# Patient Record
Sex: Male | Born: 1950 | Race: Black or African American | Hispanic: No
Health system: Southern US, Community
[De-identification: ages and names within clinical notes are randomized; demographics above are authoritative.]

---

## 2001-08-05 ENCOUNTER — Encounter: Payer: Self-pay | Admitting: Internal Medicine

## 2001-08-05 ENCOUNTER — Ambulatory Visit (HOSPITAL_COMMUNITY): Admission: RE | Admit: 2001-08-05 | Discharge: 2001-08-05 | Payer: Self-pay | Admitting: Internal Medicine

## 2001-11-05 ENCOUNTER — Ambulatory Visit (HOSPITAL_COMMUNITY): Admission: RE | Admit: 2001-11-05 | Discharge: 2001-11-05 | Payer: Self-pay | Admitting: Internal Medicine

## 2001-11-05 ENCOUNTER — Encounter: Payer: Self-pay | Admitting: Internal Medicine

## 2001-12-08 ENCOUNTER — Encounter: Payer: Self-pay | Admitting: Neurosurgery

## 2001-12-08 ENCOUNTER — Inpatient Hospital Stay (HOSPITAL_COMMUNITY): Admission: RE | Admit: 2001-12-08 | Discharge: 2001-12-09 | Payer: Self-pay | Admitting: Neurosurgery

## 2002-02-08 ENCOUNTER — Encounter: Payer: Self-pay | Admitting: Neurosurgery

## 2002-02-08 ENCOUNTER — Encounter: Admission: RE | Admit: 2002-02-08 | Discharge: 2002-02-08 | Payer: Self-pay | Admitting: Neurosurgery

## 2002-02-14 ENCOUNTER — Encounter: Payer: Self-pay | Admitting: Neurosurgery

## 2002-02-14 ENCOUNTER — Ambulatory Visit (HOSPITAL_COMMUNITY): Admission: RE | Admit: 2002-02-14 | Discharge: 2002-02-14 | Payer: Self-pay | Admitting: Neurosurgery

## 2002-03-31 ENCOUNTER — Encounter: Payer: Self-pay | Admitting: Emergency Medicine

## 2002-03-31 ENCOUNTER — Inpatient Hospital Stay (HOSPITAL_COMMUNITY): Admission: EM | Admit: 2002-03-31 | Discharge: 2002-04-04 | Payer: Self-pay | Admitting: Emergency Medicine

## 2002-04-01 ENCOUNTER — Encounter: Payer: Self-pay | Admitting: General Surgery

## 2003-12-04 ENCOUNTER — Ambulatory Visit (HOSPITAL_COMMUNITY): Admission: RE | Admit: 2003-12-04 | Discharge: 2003-12-04 | Payer: Self-pay | Admitting: Internal Medicine

## 2006-02-27 ENCOUNTER — Ambulatory Visit (HOSPITAL_COMMUNITY): Admission: RE | Admit: 2006-02-27 | Discharge: 2006-02-27 | Payer: Self-pay | Admitting: Internal Medicine

## 2006-04-17 ENCOUNTER — Ambulatory Visit (HOSPITAL_COMMUNITY): Admission: RE | Admit: 2006-04-17 | Discharge: 2006-04-17 | Payer: Self-pay | Admitting: Neurology

## 2007-09-13 ENCOUNTER — Ambulatory Visit (HOSPITAL_COMMUNITY): Admission: RE | Admit: 2007-09-13 | Discharge: 2007-09-13 | Payer: Self-pay | Admitting: Nephrology

## 2009-05-14 ENCOUNTER — Ambulatory Visit (HOSPITAL_COMMUNITY): Admission: RE | Admit: 2009-05-14 | Discharge: 2009-05-14 | Payer: Self-pay | Admitting: Ophthalmology
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
## 2011-01-10 NOTE — Consult Note (Signed)
   NAMEDERRICK, Troy Ochoa                             ACCOUNT NO.:  [id number]   MEDICAL RECORD NO.:  [id number]                   PATIENT TYPE:  INP   LOCATION:  A319                                 FACILITY:  [facility]   PHYSICIAN:  Dennie Maizes, M.D.                DATE OF BIRTH:  Oct 01, 1950   DATE OF CONSULTATION:  04/02/2002  DATE OF DISCHARGE:  04/04/2002                               UROLOGY CONSULTATION   REASON FOR CONSULTATION:  Right flank pain, right ureteral calculus.   HISTORY OF CONSULTATION REPORT:  This 60 year old male had been admitted to  [facility] on March 31, 2002.  He experienced severe right flank  pain radiating to the front associated with nausea and vomiting for about 2-  3 days.  He denied having any past history of GU problems or urolithiasis.  There is no history of voiding difficulty, gross hematuria, fever or chills.  After hospitalization, he had been having low-grade fever for the past two  days.  Evaluation by Dr. Lovell Sheehan had been done.  He has also been evaluated  with upper endoscopy.   PAST MEDICAL HISTORY:  History of hypertension, chronic back pain and  depression, status post cholecystectomy several years ago.   PHYSICAL EXAMINATION:  ABDOMEN: Soft, no palpable flank mass, mild right  flank tenderness is noted, bladder not palpable.  GENITAL: Penis and testes normal.  RECTAL: Reveals a benign prostate.   LABORATORY DATA:  Urinalysis is clear.  CBC: WBC 5.5, hemoglobin 15.2,  hematocrit 43.1.  BUN low, creatinine 1.8.   CT of the abdomen and pelvis with contrast revealed a 3.5 mm size stone  lying in the lumen of the bladder.  Probably this has passed from the right  ureter.   IMPRESSION:  Right renal colic and right ureteral calculus (passed).   The patient had been having low-grade fever for the past two days.  Urine  culture and sensitivity, blood culture and sensitivity have been done; the  results are pending.  He had been  treated with Levaquin.  The etiology as  yet is unknown at this time.   RECOMMENDATIONS:  The patient has a good chance of passing the small bladder  calculus.  We will follow the patient with you.  Thanks for this consult.                                              Dennie Maizes, M.D.   SK/MEDQ  D:  04/02/2002  T:  04/06/2002  Job:  [id number]   cc:   Tesfaye D. Felecia Shelling, M.D.

## 2011-01-10 NOTE — H&P (Signed)
NAMEDERIUS, GHOSH                             ACCOUNT NO.:  [id number]   MEDICAL RECORD NO.:  [id number]                   PATIENT TYPE:  INP   LOCATION:  A319                                 FACILITY:  [facility]   PHYSICIAN:  Tesfaye D. Felecia Shelling, M.D.              DATE OF BIRTH:  Nov 25, 1950   DATE OF ADMISSION:  03/31/2002  DATE OF DISCHARGE:  04/04/2002                                HISTORY & PHYSICAL   CHIEF COMPLAINT:  Abdominal pain, nausea and vomiting.   HISTORY OF PRESENT ILLNESS:  This is a 60 year old black male with a history  of  hypertension, back pain, osteoarthritis and a depression disorder, who  came to the emergency room with the sudden onset of right upper quadrant  pain and nausea and vomiting.  The patient was apparently doing well before  the episode, when he suddenly experienced a sharp pain in the right upper  quadrant which was immediately followed with recurrent nausea and vomiting.  The vomitus contained food and bilious material.  The patient then came to  the emergency room, where he was evaluated.  A KUB done in the emergency  room was within the normal limits.  His amylase was slightly elevated,  however, the lipase was within normal limits.  The rest of his labs  including CBC, Chem-7 and a urinalysis were within normal limits.  Surgical  consult was done in emergency room and was evaluated by Dr. Dalia Heading,  who thought there was no sign of acute abdomen.  The patient was then  admitted on IV fluid and pain and antiemetic medications.  He has no history  of alcohol and no history of pancreatitis in the past.  The patient had  surgery about 40 years ago for cholecystectomy.  He has no fever, chills,  dysuria, urgency or frequency of urination.   PAST MEDICAL HISTORY:  1. Hypertension.  2. Osteoarthritis.  3. Back pain.  4. Depression disorder.   CURRENT MEDICATIONS:  1. Lortab 5/500 mg one tablet q.6h. p.r.n.  2. Zoloft.  3. Lotrel 5/20 mg one  tablet p.o. q.d.   PERSONAL AND SOCIAL HISTORY:  The patient is currently disabled due to his  illness.  He lives alone.  Denies history of alcohol or tobacco abuse.   PHYSICAL EXAMINATION:  GENERAL:  The patient is alert, awake, acutely sick  looking.  VITALS:  Blood pressure 183/96, pulse 73, respiratory rate 24, temperature  98.8 degrees Fahrenheit.  HEENT:  Pupils are equal and reactive.  NECK:  Supple.  CHEST:  Clear lung fields with good air entry.  CARDIOVASCULAR:  First and second heart sounds heard.  No murmur.  No  gallop.  ABDOMEN:  There is diffuse tenderness.  Bowel sounds are positive.  There is  a scar on the midline and right upper quadrant area.  EXTREMITIES:  No leg edema.   LABORATORY DATA  ON ADMISSION:  WBC 5.5, hemoglobin 15.2, hematocrit 43,  platelets 216,000.  Sodium 142, potassium 3.3, chloride 109, CO2 27, glucose  126, BUN 9 and alkaline phosphatase 58, SGPT 26, SGOT 23, calcium 9.4,  lipase 26 and amylase 141.   ASSESSMENT:  1. This is a 60 year old male patient with history of hypertension and     osteoarthritis, who presented with nausea, vomiting and abdominal pain.     He has a slightly elevated amylase level.  No history of and gallbladder     disease.  The etiology of his abdominal pain is not clear at this time.     Differential diagnosis includes gastroenteritis, peptic ulcer disease or     pancreatitis.  2. Hypertension.  3. Osteoarthritis.  4. Back pain.  5. History of depression disorder.   PLAN:  Will keep the patient n.p.o.  Will start him on IV fluids.  Will  start on Protonix 40 mg IV piggyback.  Will get CT scan of his abdomen.                                               Tesfaye D. Felecia Shelling, M.D.    TDF/MEDQ  D:  04/01/2002  T:  04/04/2002  Job:  [id number]

## 2011-01-10 NOTE — Op Note (Signed)
[place]  Patient:    Troy Ochoa, Troy Ochoa Visit Number: [id number] MRN: [id number]          Service Type: SUR Location: [id number] Attending Physician:  Donn Pierini Dictated by:   Julio Sicks, M.D. Proc. 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  OPERATION PERFORMED:  C5-6 and C6-7 anterior cervical diskectomy and fusion with allograft and anterior plate instrumentation.  SURGEON:  Julio Sicks, M.D.  ASSISTANT:  Reinaldo Meeker, M.D.  ANESTHESIA:  General endotracheal.  INDICATIONS FOR PROCEDURE:  Troy Ochoa is a 60 year old male with a history of chronic neck pain and bilateral upper extremity pain with associated progressive weakness and paresthesias involving both distal upper extremities. MRI scanning demonstrates significant spondylosis at C5-6 and C6-7 with marked spinal stenosis and spinal cord compression.  The patient has been counseled as to his options.  He has decided to proceed with C5-6 and C6-7 anterior cervical diskectomy and fusion with allograft and anterior plating for hopeful improvement of symptoms.  DESCRIPTION OF PROCEDURE:  The patient was taken to the operating room and placed on the operating table in supine position.  After an adequate level of anesthesia was achieved, the patient was positioned supine with the neck slightly extended and held in place with halter traction.  The patients anterior cervical region was shaved and prepped sterilely.  A 10 blade was used to make a linear incision overlying the C6 level.  This was carried down sharply to the platysma.  The platysma was then divided vertically and dissection proceeded along the medial border of the sternocleidomastoid muscle and carotid sheath.  The trachea and esophagus were  mobilized and retracted towards the left.  Prevertebral fascia was stripped off the anterior spinal column.  The longus colli muscles were then elevated bilaterally using electrocautery.  Deep self-retaining retractor was placed.  Intraoperative fluoroscopy was used and the C5-6 and C6-7 levels were confirmed.  The disk space was then incised with a 15 blade in a rectangular fashion at both levels.  A wide disk space clean-out was then achieved at both levels using pituitary rongeurs, forward and backward angled Carlens curets, Kerrison rongeurs and a high speed drill.  All elements of the disk were removed down to the posterior annulus.  Microscope was brought into the field and starting first at C5-6, the remaining aspects of annulus and osteophytes were removed down to the level of the posterior longitudinal ligament.  The posterior longitudinal ligament was then elevated and resected in piecemeal fashion using Kerrison rongeurs.  The underlying thecal sac was identified.  A wide central decompression was then performed using Kerrison rongeurs to undercut the bodies of C5 and C6.  Wide anterior foraminotomies were then performed along the courses of the exiting C6 nerve roots bilaterally.  At this point a blunt probe was passed easily both superiorly and inferiorly out each foramen. There was no evidence of injury to thecal sac or nerve roots.  Attention was then placed at C6-7.  Once again a high speed drill was used to remove remaining annulus and osteophytes down to the level of the posterior longitudinal ligament.  The posterior longitudinal ligament was then elevated and resected  in piecemeal fashion using Kerrison rongeurs.  The underlying thecal sac was identified.  Wide central decompression was then performed by undercutting the bodies at C6 and C7 using Kerrison rongeurs.  Decompression then proceeded out to each neural foramen at C7.  Wide anterior foraminotomies were  performed using Kerrison rongeurs bilaterally.  Both C7 nerve roots were identified and found to be widely decompressed.  Once again blunt probes passed easily along the course of the exiting nerve roots both superiorly and inferiorly.  The wound was then irrigated with antibiotic solution.  Gelfoam was placed topically at both levels for hemostasis which was found to be good. 7 mm patellar wedge allografts were then fashioned and impacted into place and recessed approximately 1 mm from the anterior cortical surface.  A 42 mm Atlantis anterior cervical plate was then placed over the C5, C6 and C7 levels.  It was then attached under fluoroscopic guidance by using 14 mm variable angle screws, two each at C5, C6 and at C7.  All six screws were given a final tightening and found to be solidly within bone.  Locking screws were engaged at all three levels.  The wound was then irrigated with antibiotic solution.  Retraction system was then removed.  Final images revealed good position of the bone grafts and hardware at the proper operative level with normal alignment of the spine.  Wound was inspected for hemostasis which was found to be good.  It was then closed in typical fashion. Steri-Strips and sterile dressing were applied.  There were no apparent complications.  The patient tolerated the procedure well and he returned to the recovery room postoperatively. Dictated by:   Julio Sicks, M.D. Attending Physician:  Donn Pierini DD:  12/08/01 TD:  12/08/01 Job: [id number]

## 2011-01-10 NOTE — Discharge Summary (Signed)
   NAMEZERRICK, HANSSEN                             ACCOUNT NO.:  [id number]   MEDICAL RECORD NO.:  [id number]                   PATIENT TYPE:  INP   LOCATION:  A319                                 FACILITY:  [facility]   PHYSICIAN:  Tesfaye D. Felecia Shelling, M.D.              DATE OF BIRTH:  1951/01/16   DATE OF ADMISSION:  03/31/2002  DATE OF DISCHARGE:  04/04/2002                                 DISCHARGE SUMMARY   DISCHARGE DIAGNOSES:  1. Renal colic.  2. Fever, etiology unknown.  3. Hypertension.  4. Back pain.  5. History of depression disorder.   DISCHARGE MEDICATIONS:  1. Levaquin 500 mg p.o. q.d. for three days.  2. Lortab 5/500 1 tablet p.o. q.6h. p.r.n.  3. Zoloft 75 mg p.o. q.d.  4. Lotrel 5/20 1 tablet p.o. q.d.   DISPOSITION:  The patient is discharged home in stable condition.   HOSPITAL COURSE:  This is a 60 year old male patient with a history of  hypertension, back pain, and depression disorder, admitted due to abdominal  pain, nausea, and vomiting.  The patient was found to have renal colic, and  a urological consult was done.  While he was in the hospital the patient  started spiking a fever for which he was treated with IV Levaquin.  There  was no urine or blood culture growth, but the patient gradually improved,  and his fever subsided.  The patient is discharged in stable condition, to  continue his medication, and to be followed as an outpatient.                                               Tesfaye D. Felecia Shelling, M.D.    TDF/MEDQ  D:  05/26/2002  T:  05/29/2002  Job:  [id number]

## 2011-01-31 ENCOUNTER — Other Ambulatory Visit: Payer: Self-pay | Admitting: Neurology

## 2011-01-31 DIAGNOSIS — M5416 Radiculopathy, lumbar region: Secondary | ICD-10-CM

## 2011-02-05 ENCOUNTER — Ambulatory Visit (HOSPITAL_COMMUNITY)
Admission: RE | Admit: 2011-02-05 | Discharge: 2011-02-05 | Disposition: A | Discharge status: Home / Self Care | Payer: Medicare HMO | Source: Ambulatory Visit | Attending: Neurology | Admitting: Neurology

## 2011-02-05 DIAGNOSIS — M545 Low back pain, unspecified: Secondary | ICD-10-CM | POA: Insufficient documentation

## 2011-02-05 DIAGNOSIS — M79609 Pain in unspecified limb: Secondary | ICD-10-CM | POA: Insufficient documentation

## 2011-02-05 DIAGNOSIS — M51379 Other intervertebral disc degeneration, lumbosacral region without mention of lumbar back pain or lower extremity pain: Secondary | ICD-10-CM | POA: Insufficient documentation

## 2011-02-05 DIAGNOSIS — R209 Unspecified disturbances of skin sensation: Secondary | ICD-10-CM | POA: Insufficient documentation

## 2011-02-05 DIAGNOSIS — M5137 Other intervertebral disc degeneration, lumbosacral region: Secondary | ICD-10-CM | POA: Insufficient documentation

## 2011-02-05 DIAGNOSIS — M5126 Other intervertebral disc displacement, lumbar region: Secondary | ICD-10-CM | POA: Insufficient documentation

## 2011-02-05 DIAGNOSIS — M5416 Radiculopathy, lumbar region: Secondary | ICD-10-CM

## 2012-07-08 ENCOUNTER — Encounter (HOSPITAL_COMMUNITY): Payer: Self-pay | Admitting: Pharmacy Technician

## 2012-07-12 ENCOUNTER — Ambulatory Visit (HOSPITAL_COMMUNITY)
Admission: RE | Admit: 2012-07-12 | Discharge: 2012-07-12 | Disposition: A | Discharge status: Home / Self Care | Payer: Medicare Other | Source: Ambulatory Visit | Attending: Ophthalmology | Admitting: Ophthalmology

## 2012-07-12 ENCOUNTER — Encounter (HOSPITAL_COMMUNITY)
Admission: RE | Disposition: A | Discharge status: Home / Self Care | Payer: Self-pay | Source: Ambulatory Visit | Attending: Ophthalmology

## 2012-07-12 ENCOUNTER — Encounter (HOSPITAL_COMMUNITY): Payer: Self-pay | Admitting: *Deleted

## 2012-07-12 DIAGNOSIS — H409 Unspecified glaucoma: Secondary | ICD-10-CM | POA: Insufficient documentation

## 2012-07-12 HISTORY — PX: SLT LASER APPLICATION: SHX6099

## 2012-07-12 SURGERY — SLT LASER APPLICATION
Anesthesia: LOCAL | Laterality: Right

## 2012-07-12 MED ORDER — APRACLONIDINE HCL 1 % OP SOLN
OPHTHALMIC | Status: AC
  Filled 2012-07-12: qty 0.1

## 2012-07-12 MED ORDER — APRACLONIDINE HCL 1 % OP SOLN
1.0000 [drp] | OPHTHALMIC | Status: AC
  Administered 2012-07-12 (×3): 1 [drp] via OPHTHALMIC

## 2012-07-12 MED ORDER — PILOCARPINE HCL 1 % OP SOLN
2.0000 [drp] | Freq: Once | OPHTHALMIC | Status: AC
  Administered 2012-07-12: 2 [drp] via OPHTHALMIC

## 2012-07-12 MED ORDER — TETRACAINE HCL 0.5 % OP SOLN
1.0000 [drp] | Freq: Once | OPHTHALMIC | Status: AC
  Administered 2012-07-12: 1 [drp] via OPHTHALMIC

## 2012-07-12 MED ORDER — PILOCARPINE HCL 1 % OP SOLN
OPHTHALMIC | Status: AC
  Filled 2012-07-12: qty 15

## 2012-07-12 MED ORDER — TETRACAINE HCL 0.5 % OP SOLN
OPHTHALMIC | Status: AC
  Filled 2012-07-12: qty 2

## 2012-07-12 NOTE — H&P (Signed)
I have reviewed the pre printed H&P, the patient was re-examined, and I have identified no significant interval changes in the patient's medical condition.  There is no change in the plan of care since the history and physical of record. 

## 2012-07-12 NOTE — Brief Op Note (Signed)
Troy Ochoa 07/12/2012  Susa Simmonds, MD  Yag Laser Self Test Completedyes. Procedure: SLT, right eye.  Eye Protection Worn by Staff yes. Laser In Use Sign on Door yes.  Laser: Nd:YAG Spot Size: Fixed Burst Mode: n/a Power Setting: 0.9 mJ/burst Position treated: 360degrees Number of shots: 64 Total energy delivered: 57.6 mJ  Patency of the peripheral iridotomy was confirmed visually.  The patient tolerated the procedure without difficulty. No complications were encountered.  Tenometer reading immediately after procedure: 18 mmHg.  The patient was discharged home with the instructions to continue all his current glaucoma medications, if any.  Patient verbalizes understanding of discharge instructions yes.   Notes:360 open, grade 4/4 with mild pigment

## 2012-07-13 ENCOUNTER — Encounter (HOSPITAL_COMMUNITY): Payer: Self-pay | Admitting: Ophthalmology

## 2014-02-16 ENCOUNTER — Other Ambulatory Visit: Payer: Self-pay | Admitting: Neurology

## 2014-02-16 ENCOUNTER — Ambulatory Visit (HOSPITAL_COMMUNITY)
Admission: RE | Admit: 2014-02-16 | Discharge: 2014-02-16 | Disposition: A | Discharge status: Home / Self Care | Payer: Medicare Other | Source: Ambulatory Visit | Attending: Neurology | Admitting: Neurology

## 2014-02-16 DIAGNOSIS — M47817 Spondylosis without myelopathy or radiculopathy, lumbosacral region: Secondary | ICD-10-CM | POA: Insufficient documentation

## 2014-02-16 DIAGNOSIS — M5146 Schmorl's nodes, lumbar region: Secondary | ICD-10-CM | POA: Insufficient documentation

## 2014-02-16 DIAGNOSIS — IMO0002 Reserved for concepts with insufficient information to code with codable children: Secondary | ICD-10-CM

## 2014-02-16 DIAGNOSIS — I700 Atherosclerosis of aorta: Secondary | ICD-10-CM | POA: Insufficient documentation

## 2015-08-30 DIAGNOSIS — I10 Essential (primary) hypertension: Secondary | ICD-10-CM | POA: Diagnosis not present

## 2015-08-30 DIAGNOSIS — M5090 Cervical disc disorder, unspecified, unspecified cervical region: Secondary | ICD-10-CM | POA: Diagnosis not present

## 2015-08-30 DIAGNOSIS — K219 Gastro-esophageal reflux disease without esophagitis: Secondary | ICD-10-CM | POA: Diagnosis not present

## 2015-08-30 DIAGNOSIS — Z6839 Body mass index (BMI) 39.0-39.9, adult: Secondary | ICD-10-CM | POA: Diagnosis not present

## 2015-10-09 DIAGNOSIS — Z79899 Other long term (current) drug therapy: Secondary | ICD-10-CM | POA: Diagnosis not present

## 2015-10-09 DIAGNOSIS — M545 Low back pain: Secondary | ICD-10-CM | POA: Diagnosis not present

## 2015-10-09 DIAGNOSIS — G4710 Hypersomnia, unspecified: Secondary | ICD-10-CM | POA: Diagnosis not present

## 2015-10-09 DIAGNOSIS — I10 Essential (primary) hypertension: Secondary | ICD-10-CM | POA: Diagnosis not present

## 2015-11-29 DIAGNOSIS — I10 Essential (primary) hypertension: Secondary | ICD-10-CM | POA: Diagnosis not present

## 2015-11-29 DIAGNOSIS — K219 Gastro-esophageal reflux disease without esophagitis: Secondary | ICD-10-CM | POA: Diagnosis not present

## 2015-11-29 DIAGNOSIS — G4733 Obstructive sleep apnea (adult) (pediatric): Secondary | ICD-10-CM | POA: Diagnosis not present

## 2015-11-30 DIAGNOSIS — Z79899 Other long term (current) drug therapy: Secondary | ICD-10-CM | POA: Diagnosis not present

## 2015-11-30 DIAGNOSIS — G4710 Hypersomnia, unspecified: Secondary | ICD-10-CM | POA: Diagnosis not present

## 2015-11-30 DIAGNOSIS — M545 Low back pain: Secondary | ICD-10-CM | POA: Diagnosis not present

## 2015-11-30 DIAGNOSIS — I10 Essential (primary) hypertension: Secondary | ICD-10-CM | POA: Diagnosis not present

## 2015-12-06 DIAGNOSIS — H2513 Age-related nuclear cataract, bilateral: Secondary | ICD-10-CM | POA: Diagnosis not present

## 2015-12-06 DIAGNOSIS — H401132 Primary open-angle glaucoma, bilateral, moderate stage: Secondary | ICD-10-CM | POA: Diagnosis not present
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
## 2016-01-14 DIAGNOSIS — H401132 Primary open-angle glaucoma, bilateral, moderate stage: Secondary | ICD-10-CM | POA: Diagnosis not present

## 2016-01-28 DIAGNOSIS — H401132 Primary open-angle glaucoma, bilateral, moderate stage: Secondary | ICD-10-CM | POA: Diagnosis not present

## 2016-01-30 DIAGNOSIS — I10 Essential (primary) hypertension: Secondary | ICD-10-CM | POA: Diagnosis not present

## 2016-01-30 DIAGNOSIS — M545 Low back pain: Secondary | ICD-10-CM | POA: Diagnosis not present

## 2016-01-30 DIAGNOSIS — Z79899 Other long term (current) drug therapy: Secondary | ICD-10-CM | POA: Diagnosis not present

## 2016-01-30 DIAGNOSIS — G4710 Hypersomnia, unspecified: Secondary | ICD-10-CM | POA: Diagnosis not present

## 2016-03-13 DIAGNOSIS — K219 Gastro-esophageal reflux disease without esophagitis: Secondary | ICD-10-CM | POA: Diagnosis not present

## 2016-03-13 DIAGNOSIS — M545 Low back pain: Secondary | ICD-10-CM | POA: Diagnosis not present

## 2016-03-13 DIAGNOSIS — I10 Essential (primary) hypertension: Secondary | ICD-10-CM | POA: Diagnosis not present

## 2016-04-01 DIAGNOSIS — M545 Low back pain: Secondary | ICD-10-CM | POA: Diagnosis not present

## 2016-04-01 DIAGNOSIS — G4710 Hypersomnia, unspecified: Secondary | ICD-10-CM | POA: Diagnosis not present

## 2016-04-01 DIAGNOSIS — Z79891 Long term (current) use of opiate analgesic: Secondary | ICD-10-CM | POA: Diagnosis not present

## 2016-04-01 DIAGNOSIS — I10 Essential (primary) hypertension: Secondary | ICD-10-CM | POA: Diagnosis not present

## 2016-04-01 DIAGNOSIS — R252 Cramp and spasm: Secondary | ICD-10-CM | POA: Diagnosis not present

## 2016-05-21 DIAGNOSIS — Z23 Encounter for immunization: Secondary | ICD-10-CM | POA: Diagnosis not present

## 2016-05-29 DIAGNOSIS — M545 Low back pain: Secondary | ICD-10-CM | POA: Diagnosis not present

## 2016-05-29 DIAGNOSIS — G4710 Hypersomnia, unspecified: Secondary | ICD-10-CM | POA: Diagnosis not present

## 2016-05-29 DIAGNOSIS — I10 Essential (primary) hypertension: Secondary | ICD-10-CM | POA: Diagnosis not present

## 2016-05-29 DIAGNOSIS — Z79891 Long term (current) use of opiate analgesic: Secondary | ICD-10-CM | POA: Diagnosis not present

## 2016-05-29 DIAGNOSIS — R252 Cramp and spasm: Secondary | ICD-10-CM | POA: Diagnosis not present

## 2016-05-29 DIAGNOSIS — M130 Polyarthritis, unspecified: Secondary | ICD-10-CM | POA: Diagnosis not present

## 2016-06-17 DIAGNOSIS — M545 Low back pain: Secondary | ICD-10-CM | POA: Diagnosis not present

## 2016-06-17 DIAGNOSIS — G4733 Obstructive sleep apnea (adult) (pediatric): Secondary | ICD-10-CM | POA: Diagnosis not present

## 2016-06-17 DIAGNOSIS — G479 Sleep disorder, unspecified: Secondary | ICD-10-CM | POA: Diagnosis not present

## 2016-06-17 DIAGNOSIS — K219 Gastro-esophageal reflux disease without esophagitis: Secondary | ICD-10-CM | POA: Diagnosis not present

## 2016-06-17 DIAGNOSIS — I10 Essential (primary) hypertension: Secondary | ICD-10-CM | POA: Diagnosis not present

## 2016-06-17 DIAGNOSIS — E669 Obesity, unspecified: Secondary | ICD-10-CM | POA: Diagnosis not present

## 2016-07-23 DIAGNOSIS — Z79891 Long term (current) use of opiate analgesic: Secondary | ICD-10-CM | POA: Diagnosis not present

## 2016-07-23 DIAGNOSIS — M130 Polyarthritis, unspecified: Secondary | ICD-10-CM | POA: Diagnosis not present

## 2016-07-23 DIAGNOSIS — G4710 Hypersomnia, unspecified: Secondary | ICD-10-CM | POA: Diagnosis not present

## 2016-07-23 DIAGNOSIS — I10 Essential (primary) hypertension: Secondary | ICD-10-CM | POA: Diagnosis not present

## 2016-07-23 DIAGNOSIS — M545 Low back pain: Secondary | ICD-10-CM | POA: Diagnosis not present

## 2016-09-08 DIAGNOSIS — I10 Essential (primary) hypertension: Secondary | ICD-10-CM | POA: Diagnosis not present

## 2016-09-08 DIAGNOSIS — J40 Bronchitis, not specified as acute or chronic: Secondary | ICD-10-CM | POA: Diagnosis not present

## 2016-09-25 DIAGNOSIS — M545 Low back pain: Secondary | ICD-10-CM | POA: Diagnosis not present

## 2016-09-25 DIAGNOSIS — I10 Essential (primary) hypertension: Secondary | ICD-10-CM | POA: Diagnosis not present

## 2016-09-25 DIAGNOSIS — G4710 Hypersomnia, unspecified: Secondary | ICD-10-CM | POA: Diagnosis not present

## 2016-09-25 DIAGNOSIS — Z79891 Long term (current) use of opiate analgesic: Secondary | ICD-10-CM | POA: Diagnosis not present

## 2016-09-25 DIAGNOSIS — M130 Polyarthritis, unspecified: Secondary | ICD-10-CM | POA: Diagnosis not present

## 2016-11-24 DIAGNOSIS — G4710 Hypersomnia, unspecified: Secondary | ICD-10-CM | POA: Diagnosis not present

## 2016-11-24 DIAGNOSIS — Z79891 Long term (current) use of opiate analgesic: Secondary | ICD-10-CM | POA: Diagnosis not present

## 2016-11-24 DIAGNOSIS — M130 Polyarthritis, unspecified: Secondary | ICD-10-CM | POA: Diagnosis not present

## 2016-11-24 DIAGNOSIS — I10 Essential (primary) hypertension: Secondary | ICD-10-CM | POA: Diagnosis not present

## 2016-11-24 DIAGNOSIS — M545 Low back pain: Secondary | ICD-10-CM | POA: Diagnosis not present

## 2016-12-09 DIAGNOSIS — M5090 Cervical disc disorder, unspecified, unspecified cervical region: Secondary | ICD-10-CM | POA: Diagnosis not present

## 2016-12-09 DIAGNOSIS — I10 Essential (primary) hypertension: Secondary | ICD-10-CM | POA: Diagnosis not present

## 2016-12-09 DIAGNOSIS — K219 Gastro-esophageal reflux disease without esophagitis: Secondary | ICD-10-CM | POA: Diagnosis not present

## 2017-01-26 DIAGNOSIS — I10 Essential (primary) hypertension: Secondary | ICD-10-CM | POA: Diagnosis not present

## 2017-01-26 DIAGNOSIS — G4710 Hypersomnia, unspecified: Secondary | ICD-10-CM | POA: Diagnosis not present

## 2017-01-26 DIAGNOSIS — Z79891 Long term (current) use of opiate analgesic: Secondary | ICD-10-CM | POA: Diagnosis not present

## 2017-01-26 DIAGNOSIS — M545 Low back pain: Secondary | ICD-10-CM | POA: Diagnosis not present

## 2017-01-26 DIAGNOSIS — M130 Polyarthritis, unspecified: Secondary | ICD-10-CM | POA: Diagnosis not present

## 2017-03-18 DIAGNOSIS — H5213 Myopia, bilateral: Secondary | ICD-10-CM | POA: Diagnosis not present

## 2017-03-18 DIAGNOSIS — H52203 Unspecified astigmatism, bilateral: Secondary | ICD-10-CM | POA: Diagnosis not present

## 2017-03-18 DIAGNOSIS — H524 Presbyopia: Secondary | ICD-10-CM | POA: Diagnosis not present

## 2017-03-24 DIAGNOSIS — M545 Low back pain: Secondary | ICD-10-CM | POA: Diagnosis not present

## 2017-03-24 DIAGNOSIS — E785 Hyperlipidemia, unspecified: Secondary | ICD-10-CM | POA: Diagnosis not present

## 2017-03-24 DIAGNOSIS — M5090 Cervical disc disorder, unspecified, unspecified cervical region: Secondary | ICD-10-CM | POA: Diagnosis not present

## 2017-03-24 DIAGNOSIS — I10 Essential (primary) hypertension: Secondary | ICD-10-CM | POA: Diagnosis not present

## 2017-03-26 DIAGNOSIS — M545 Low back pain: Secondary | ICD-10-CM | POA: Diagnosis not present

## 2017-03-26 DIAGNOSIS — I10 Essential (primary) hypertension: Secondary | ICD-10-CM | POA: Diagnosis not present

## 2017-03-26 DIAGNOSIS — Z79891 Long term (current) use of opiate analgesic: Secondary | ICD-10-CM | POA: Diagnosis not present

## 2017-03-26 DIAGNOSIS — M130 Polyarthritis, unspecified: Secondary | ICD-10-CM | POA: Diagnosis not present

## 2017-03-26 DIAGNOSIS — G4710 Hypersomnia, unspecified: Secondary | ICD-10-CM | POA: Diagnosis not present

## 2017-06-16 DIAGNOSIS — Z23 Encounter for immunization: Secondary | ICD-10-CM | POA: Diagnosis not present

## 2017-06-22 DIAGNOSIS — K219 Gastro-esophageal reflux disease without esophagitis: Secondary | ICD-10-CM | POA: Diagnosis not present

## 2017-06-22 DIAGNOSIS — E669 Obesity, unspecified: Secondary | ICD-10-CM | POA: Diagnosis not present

## 2017-06-22 DIAGNOSIS — G479 Sleep disorder, unspecified: Secondary | ICD-10-CM | POA: Diagnosis not present

## 2017-06-22 DIAGNOSIS — I10 Essential (primary) hypertension: Secondary | ICD-10-CM | POA: Diagnosis not present

## 2017-06-22 DIAGNOSIS — M5090 Cervical disc disorder, unspecified, unspecified cervical region: Secondary | ICD-10-CM | POA: Diagnosis not present

## 2017-06-22 DIAGNOSIS — Z1389 Encounter for screening for other disorder: Secondary | ICD-10-CM | POA: Diagnosis not present

## 2017-06-30 DIAGNOSIS — Z1211 Encounter for screening for malignant neoplasm of colon: Secondary | ICD-10-CM | POA: Diagnosis not present

## 2017-06-30 DIAGNOSIS — Z1212 Encounter for screening for malignant neoplasm of rectum: Secondary | ICD-10-CM | POA: Diagnosis not present

## 2017-07-27 DIAGNOSIS — G4710 Hypersomnia, unspecified: Secondary | ICD-10-CM | POA: Diagnosis not present

## 2017-07-27 DIAGNOSIS — I10 Essential (primary) hypertension: Secondary | ICD-10-CM | POA: Diagnosis not present

## 2017-07-27 DIAGNOSIS — M545 Low back pain: Secondary | ICD-10-CM | POA: Diagnosis not present

## 2017-07-27 DIAGNOSIS — Z79891 Long term (current) use of opiate analgesic: Secondary | ICD-10-CM | POA: Diagnosis not present

## 2017-09-17 DIAGNOSIS — I10 Essential (primary) hypertension: Secondary | ICD-10-CM | POA: Diagnosis not present

## 2017-09-17 DIAGNOSIS — K219 Gastro-esophageal reflux disease without esophagitis: Secondary | ICD-10-CM | POA: Diagnosis not present

## 2017-09-17 DIAGNOSIS — G4733 Obstructive sleep apnea (adult) (pediatric): Secondary | ICD-10-CM | POA: Diagnosis not present

## 2017-10-23 DIAGNOSIS — I10 Essential (primary) hypertension: Secondary | ICD-10-CM | POA: Diagnosis not present

## 2017-10-23 DIAGNOSIS — G4710 Hypersomnia, unspecified: Secondary | ICD-10-CM | POA: Diagnosis not present

## 2017-10-23 DIAGNOSIS — Z79891 Long term (current) use of opiate analgesic: Secondary | ICD-10-CM | POA: Diagnosis not present

## 2017-10-23 DIAGNOSIS — M545 Low back pain: Secondary | ICD-10-CM | POA: Diagnosis not present

## 2017-12-17 DIAGNOSIS — M5090 Cervical disc disorder, unspecified, unspecified cervical region: Secondary | ICD-10-CM | POA: Diagnosis not present

## 2017-12-17 DIAGNOSIS — I10 Essential (primary) hypertension: Secondary | ICD-10-CM | POA: Diagnosis not present

## 2017-12-17 DIAGNOSIS — K219 Gastro-esophageal reflux disease without esophagitis: Secondary | ICD-10-CM | POA: Diagnosis not present

## 2018-01-25 DIAGNOSIS — G4710 Hypersomnia, unspecified: Secondary | ICD-10-CM | POA: Diagnosis not present

## 2018-01-25 DIAGNOSIS — M545 Low back pain: Secondary | ICD-10-CM | POA: Diagnosis not present

## 2018-01-25 DIAGNOSIS — Z79891 Long term (current) use of opiate analgesic: Secondary | ICD-10-CM | POA: Diagnosis not present

## 2018-01-25 DIAGNOSIS — I10 Essential (primary) hypertension: Secondary | ICD-10-CM | POA: Diagnosis not present

## 2018-03-18 DIAGNOSIS — I10 Essential (primary) hypertension: Secondary | ICD-10-CM | POA: Diagnosis not present

## 2018-03-18 DIAGNOSIS — G4733 Obstructive sleep apnea (adult) (pediatric): Secondary | ICD-10-CM | POA: Diagnosis not present

## 2018-03-18 DIAGNOSIS — K219 Gastro-esophageal reflux disease without esophagitis: Secondary | ICD-10-CM | POA: Diagnosis not present

## 2018-04-27 DIAGNOSIS — G4710 Hypersomnia, unspecified: Secondary | ICD-10-CM | POA: Diagnosis not present

## 2018-04-27 DIAGNOSIS — M545 Low back pain: Secondary | ICD-10-CM | POA: Diagnosis not present

## 2018-04-27 DIAGNOSIS — Z79891 Long term (current) use of opiate analgesic: Secondary | ICD-10-CM | POA: Diagnosis not present

## 2018-04-27 DIAGNOSIS — I10 Essential (primary) hypertension: Secondary | ICD-10-CM | POA: Diagnosis not present

## 2018-06-16 DIAGNOSIS — K219 Gastro-esophageal reflux disease without esophagitis: Secondary | ICD-10-CM | POA: Diagnosis not present

## 2018-06-16 DIAGNOSIS — G4733 Obstructive sleep apnea (adult) (pediatric): Secondary | ICD-10-CM | POA: Diagnosis not present

## 2018-06-16 DIAGNOSIS — I10 Essential (primary) hypertension: Secondary | ICD-10-CM | POA: Diagnosis not present

## 2018-06-16 DIAGNOSIS — E669 Obesity, unspecified: Secondary | ICD-10-CM | POA: Diagnosis not present

## 2018-06-16 DIAGNOSIS — G479 Sleep disorder, unspecified: Secondary | ICD-10-CM | POA: Diagnosis not present

## 2018-06-21 DIAGNOSIS — Z1389 Encounter for screening for other disorder: Secondary | ICD-10-CM | POA: Diagnosis not present

## 2018-06-21 DIAGNOSIS — R7302 Impaired glucose tolerance (oral): Secondary | ICD-10-CM | POA: Diagnosis not present

## 2018-06-21 DIAGNOSIS — Z23 Encounter for immunization: Secondary | ICD-10-CM | POA: Diagnosis not present

## 2018-06-21 DIAGNOSIS — Z1331 Encounter for screening for depression: Secondary | ICD-10-CM | POA: Diagnosis not present

## 2018-06-21 DIAGNOSIS — Z0001 Encounter for general adult medical examination with abnormal findings: Secondary | ICD-10-CM | POA: Diagnosis not present

## 2018-06-21 DIAGNOSIS — K219 Gastro-esophageal reflux disease without esophagitis: Secondary | ICD-10-CM | POA: Diagnosis not present

## 2018-06-21 DIAGNOSIS — I10 Essential (primary) hypertension: Secondary | ICD-10-CM | POA: Diagnosis not present

## 2018-07-28 DIAGNOSIS — Z79891 Long term (current) use of opiate analgesic: Secondary | ICD-10-CM | POA: Diagnosis not present

## 2018-07-28 DIAGNOSIS — M545 Low back pain: Secondary | ICD-10-CM | POA: Diagnosis not present

## 2018-07-28 DIAGNOSIS — G4710 Hypersomnia, unspecified: Secondary | ICD-10-CM | POA: Diagnosis not present

## 2018-07-28 DIAGNOSIS — I10 Essential (primary) hypertension: Secondary | ICD-10-CM | POA: Diagnosis not present

## 2018-09-20 DIAGNOSIS — M5090 Cervical disc disorder, unspecified, unspecified cervical region: Secondary | ICD-10-CM | POA: Diagnosis not present

## 2018-09-20 DIAGNOSIS — I10 Essential (primary) hypertension: Secondary | ICD-10-CM | POA: Diagnosis not present

## 2018-09-20 DIAGNOSIS — K219 Gastro-esophageal reflux disease without esophagitis: Secondary | ICD-10-CM | POA: Diagnosis not present

## 2018-10-26 DIAGNOSIS — Z79891 Long term (current) use of opiate analgesic: Secondary | ICD-10-CM | POA: Diagnosis not present

## 2018-10-26 DIAGNOSIS — M545 Low back pain: Secondary | ICD-10-CM | POA: Diagnosis not present

## 2018-10-26 DIAGNOSIS — G4710 Hypersomnia, unspecified: Secondary | ICD-10-CM | POA: Diagnosis not present

## 2018-10-26 DIAGNOSIS — I10 Essential (primary) hypertension: Secondary | ICD-10-CM | POA: Diagnosis not present

## 2019-01-27 DIAGNOSIS — Z79891 Long term (current) use of opiate analgesic: Secondary | ICD-10-CM | POA: Diagnosis not present

## 2019-01-27 DIAGNOSIS — G4710 Hypersomnia, unspecified: Secondary | ICD-10-CM | POA: Diagnosis not present

## 2019-01-27 DIAGNOSIS — I10 Essential (primary) hypertension: Secondary | ICD-10-CM | POA: Diagnosis not present

## 2019-01-27 DIAGNOSIS — M545 Low back pain: Secondary | ICD-10-CM | POA: Diagnosis not present

## 2019-03-17 DIAGNOSIS — I10 Essential (primary) hypertension: Secondary | ICD-10-CM | POA: Diagnosis not present

## 2019-03-17 DIAGNOSIS — Z1331 Encounter for screening for depression: Secondary | ICD-10-CM | POA: Diagnosis not present

## 2019-03-17 DIAGNOSIS — Z1389 Encounter for screening for other disorder: Secondary | ICD-10-CM | POA: Diagnosis not present

## 2019-03-17 DIAGNOSIS — K219 Gastro-esophageal reflux disease without esophagitis: Secondary | ICD-10-CM | POA: Diagnosis not present

## 2019-03-17 DIAGNOSIS — Z0001 Encounter for general adult medical examination with abnormal findings: Secondary | ICD-10-CM | POA: Diagnosis not present

## 2019-03-22 DIAGNOSIS — Z0001 Encounter for general adult medical examination with abnormal findings: Secondary | ICD-10-CM | POA: Diagnosis not present

## 2019-03-22 DIAGNOSIS — I10 Essential (primary) hypertension: Secondary | ICD-10-CM | POA: Diagnosis not present

## 2019-03-23 ENCOUNTER — Other Ambulatory Visit: Payer: Self-pay

## 2019-03-30 DIAGNOSIS — R945 Abnormal results of liver function studies: Secondary | ICD-10-CM | POA: Diagnosis not present

## 2019-04-17 DIAGNOSIS — I10 Essential (primary) hypertension: Secondary | ICD-10-CM | POA: Diagnosis not present

## 2019-04-17 DIAGNOSIS — K219 Gastro-esophageal reflux disease without esophagitis: Secondary | ICD-10-CM | POA: Diagnosis not present

## 2019-04-28 DIAGNOSIS — G894 Chronic pain syndrome: Secondary | ICD-10-CM | POA: Diagnosis not present

## 2019-04-28 DIAGNOSIS — G4710 Hypersomnia, unspecified: Secondary | ICD-10-CM | POA: Diagnosis not present

## 2019-04-28 DIAGNOSIS — M545 Low back pain: Secondary | ICD-10-CM | POA: Diagnosis not present

## 2019-04-28 DIAGNOSIS — Z79891 Long term (current) use of opiate analgesic: Secondary | ICD-10-CM | POA: Diagnosis not present

## 2019-04-28 DIAGNOSIS — I10 Essential (primary) hypertension: Secondary | ICD-10-CM | POA: Diagnosis not present

## 2019-05-18 DIAGNOSIS — K219 Gastro-esophageal reflux disease without esophagitis: Secondary | ICD-10-CM | POA: Diagnosis not present

## 2019-05-18 DIAGNOSIS — I10 Essential (primary) hypertension: Secondary | ICD-10-CM | POA: Diagnosis not present

## 2019-05-25 DIAGNOSIS — Z23 Encounter for immunization: Secondary | ICD-10-CM | POA: Diagnosis not present

## 2019-06-17 DIAGNOSIS — I10 Essential (primary) hypertension: Secondary | ICD-10-CM | POA: Diagnosis not present

## 2019-06-17 DIAGNOSIS — K219 Gastro-esophageal reflux disease without esophagitis: Secondary | ICD-10-CM | POA: Diagnosis not present

## 2019-07-18 DIAGNOSIS — I10 Essential (primary) hypertension: Secondary | ICD-10-CM | POA: Diagnosis not present

## 2019-07-18 DIAGNOSIS — K219 Gastro-esophageal reflux disease without esophagitis: Secondary | ICD-10-CM | POA: Diagnosis not present

## 2019-07-20 ENCOUNTER — Other Ambulatory Visit: Payer: Self-pay

## 2019-07-28 DIAGNOSIS — G4710 Hypersomnia, unspecified: Secondary | ICD-10-CM | POA: Diagnosis not present

## 2019-07-28 DIAGNOSIS — M545 Low back pain: Secondary | ICD-10-CM | POA: Diagnosis not present

## 2019-07-28 DIAGNOSIS — I10 Essential (primary) hypertension: Secondary | ICD-10-CM | POA: Diagnosis not present

## 2019-07-28 DIAGNOSIS — Z79891 Long term (current) use of opiate analgesic: Secondary | ICD-10-CM | POA: Diagnosis not present

## 2019-08-02 DIAGNOSIS — K219 Gastro-esophageal reflux disease without esophagitis: Secondary | ICD-10-CM | POA: Diagnosis not present

## 2019-08-02 DIAGNOSIS — G4733 Obstructive sleep apnea (adult) (pediatric): Secondary | ICD-10-CM | POA: Diagnosis not present

## 2019-08-02 DIAGNOSIS — I10 Essential (primary) hypertension: Secondary | ICD-10-CM | POA: Diagnosis not present

## 2019-09-02 DIAGNOSIS — K219 Gastro-esophageal reflux disease without esophagitis: Secondary | ICD-10-CM | POA: Diagnosis not present

## 2019-09-02 DIAGNOSIS — I10 Essential (primary) hypertension: Secondary | ICD-10-CM | POA: Diagnosis not present

## 2019-09-02 DIAGNOSIS — Z79899 Other long term (current) drug therapy: Secondary | ICD-10-CM | POA: Diagnosis not present

## 2019-10-03 DIAGNOSIS — K219 Gastro-esophageal reflux disease without esophagitis: Secondary | ICD-10-CM | POA: Diagnosis not present

## 2019-10-03 DIAGNOSIS — I10 Essential (primary) hypertension: Secondary | ICD-10-CM | POA: Diagnosis not present

## 2019-10-25 DIAGNOSIS — G4710 Hypersomnia, unspecified: Secondary | ICD-10-CM | POA: Diagnosis not present

## 2019-10-25 DIAGNOSIS — I10 Essential (primary) hypertension: Secondary | ICD-10-CM | POA: Diagnosis not present

## 2019-10-25 DIAGNOSIS — Z79891 Long term (current) use of opiate analgesic: Secondary | ICD-10-CM | POA: Diagnosis not present

## 2019-10-25 DIAGNOSIS — M545 Low back pain: Secondary | ICD-10-CM | POA: Diagnosis not present

## 2019-10-31 DIAGNOSIS — I10 Essential (primary) hypertension: Secondary | ICD-10-CM | POA: Diagnosis not present

## 2019-10-31 DIAGNOSIS — M5090 Cervical disc disorder, unspecified, unspecified cervical region: Secondary | ICD-10-CM | POA: Diagnosis not present

## 2019-12-13 DIAGNOSIS — M545 Low back pain: Secondary | ICD-10-CM | POA: Diagnosis not present

## 2019-12-13 DIAGNOSIS — I10 Essential (primary) hypertension: Secondary | ICD-10-CM | POA: Diagnosis not present

## 2019-12-13 DIAGNOSIS — K219 Gastro-esophageal reflux disease without esophagitis: Secondary | ICD-10-CM | POA: Diagnosis not present

## 2020-01-12 DIAGNOSIS — K219 Gastro-esophageal reflux disease without esophagitis: Secondary | ICD-10-CM | POA: Diagnosis not present

## 2020-01-12 DIAGNOSIS — I10 Essential (primary) hypertension: Secondary | ICD-10-CM | POA: Diagnosis not present

## 2020-01-26 DIAGNOSIS — I10 Essential (primary) hypertension: Secondary | ICD-10-CM | POA: Diagnosis not present

## 2020-01-26 DIAGNOSIS — M545 Low back pain: Secondary | ICD-10-CM | POA: Diagnosis not present

## 2020-01-26 DIAGNOSIS — Z79891 Long term (current) use of opiate analgesic: Secondary | ICD-10-CM | POA: Diagnosis not present

## 2020-01-26 DIAGNOSIS — G4710 Hypersomnia, unspecified: Secondary | ICD-10-CM | POA: Diagnosis not present

## 2020-02-12 DIAGNOSIS — M5090 Cervical disc disorder, unspecified, unspecified cervical region: Secondary | ICD-10-CM | POA: Diagnosis not present

## 2020-02-12 DIAGNOSIS — I10 Essential (primary) hypertension: Secondary | ICD-10-CM | POA: Diagnosis not present

## 2020-03-13 DIAGNOSIS — G4733 Obstructive sleep apnea (adult) (pediatric): Secondary | ICD-10-CM | POA: Diagnosis not present

## 2020-03-13 DIAGNOSIS — K219 Gastro-esophageal reflux disease without esophagitis: Secondary | ICD-10-CM | POA: Diagnosis not present

## 2020-03-20 ENCOUNTER — Other Ambulatory Visit: Payer: Self-pay

## 2020-04-13 DIAGNOSIS — I10 Essential (primary) hypertension: Secondary | ICD-10-CM | POA: Diagnosis not present

## 2020-04-13 DIAGNOSIS — K219 Gastro-esophageal reflux disease without esophagitis: Secondary | ICD-10-CM | POA: Diagnosis not present

## 2020-04-26 DIAGNOSIS — I10 Essential (primary) hypertension: Secondary | ICD-10-CM | POA: Diagnosis not present

## 2020-04-26 DIAGNOSIS — Z79891 Long term (current) use of opiate analgesic: Secondary | ICD-10-CM | POA: Diagnosis not present

## 2020-04-26 DIAGNOSIS — M545 Low back pain: Secondary | ICD-10-CM | POA: Diagnosis not present

## 2020-04-26 DIAGNOSIS — G4710 Hypersomnia, unspecified: Secondary | ICD-10-CM | POA: Diagnosis not present

## 2020-05-14 DIAGNOSIS — I10 Essential (primary) hypertension: Secondary | ICD-10-CM | POA: Diagnosis not present

## 2020-05-14 DIAGNOSIS — K219 Gastro-esophageal reflux disease without esophagitis: Secondary | ICD-10-CM | POA: Diagnosis not present

## 2020-05-28 DIAGNOSIS — K136 Irritative hyperplasia of oral mucosa: Secondary | ICD-10-CM | POA: Diagnosis not present

## 2020-05-28 DIAGNOSIS — K122 Cellulitis and abscess of mouth: Secondary | ICD-10-CM | POA: Diagnosis not present

## 2020-05-28 DIAGNOSIS — M278 Other specified diseases of jaws: Secondary | ICD-10-CM | POA: Diagnosis not present

## 2020-06-05 DIAGNOSIS — Z1389 Encounter for screening for other disorder: Secondary | ICD-10-CM | POA: Diagnosis not present

## 2020-06-05 DIAGNOSIS — I10 Essential (primary) hypertension: Secondary | ICD-10-CM | POA: Diagnosis not present

## 2020-06-05 DIAGNOSIS — Z0001 Encounter for general adult medical examination with abnormal findings: Secondary | ICD-10-CM | POA: Diagnosis not present

## 2020-06-05 DIAGNOSIS — K219 Gastro-esophageal reflux disease without esophagitis: Secondary | ICD-10-CM | POA: Diagnosis not present

## 2020-06-05 DIAGNOSIS — Z23 Encounter for immunization: Secondary | ICD-10-CM | POA: Diagnosis not present

## 2020-06-05 DIAGNOSIS — Z1331 Encounter for screening for depression: Secondary | ICD-10-CM | POA: Diagnosis not present

## 2020-07-05 DIAGNOSIS — I10 Essential (primary) hypertension: Secondary | ICD-10-CM | POA: Diagnosis not present

## 2020-07-05 DIAGNOSIS — M5090 Cervical disc disorder, unspecified, unspecified cervical region: Secondary | ICD-10-CM | POA: Diagnosis not present

## 2020-07-11 DIAGNOSIS — I10 Essential (primary) hypertension: Secondary | ICD-10-CM | POA: Diagnosis not present

## 2020-07-11 DIAGNOSIS — Z0001 Encounter for general adult medical examination with abnormal findings: Secondary | ICD-10-CM | POA: Diagnosis not present

## 2020-07-25 DIAGNOSIS — I10 Essential (primary) hypertension: Secondary | ICD-10-CM | POA: Diagnosis not present

## 2020-07-25 DIAGNOSIS — Z79891 Long term (current) use of opiate analgesic: Secondary | ICD-10-CM | POA: Diagnosis not present

## 2020-07-25 DIAGNOSIS — G4710 Hypersomnia, unspecified: Secondary | ICD-10-CM | POA: Diagnosis not present

## 2020-07-25 DIAGNOSIS — M545 Low back pain, unspecified: Secondary | ICD-10-CM | POA: Diagnosis not present

## 2020-08-05 DIAGNOSIS — K219 Gastro-esophageal reflux disease without esophagitis: Secondary | ICD-10-CM | POA: Diagnosis not present

## 2020-08-05 DIAGNOSIS — I10 Essential (primary) hypertension: Secondary | ICD-10-CM | POA: Diagnosis not present

## 2020-08-23 DIAGNOSIS — Z1212 Encounter for screening for malignant neoplasm of rectum: Secondary | ICD-10-CM | POA: Diagnosis not present

## 2020-08-23 DIAGNOSIS — Z1211 Encounter for screening for malignant neoplasm of colon: Secondary | ICD-10-CM | POA: Diagnosis not present

## 2020-09-05 DIAGNOSIS — K219 Gastro-esophageal reflux disease without esophagitis: Secondary | ICD-10-CM | POA: Diagnosis not present

## 2020-09-05 DIAGNOSIS — I10 Essential (primary) hypertension: Secondary | ICD-10-CM | POA: Diagnosis not present

## 2020-10-06 DIAGNOSIS — K219 Gastro-esophageal reflux disease without esophagitis: Secondary | ICD-10-CM | POA: Diagnosis not present

## 2020-10-06 DIAGNOSIS — I10 Essential (primary) hypertension: Secondary | ICD-10-CM | POA: Diagnosis not present

## 2020-10-26 DIAGNOSIS — I10 Essential (primary) hypertension: Secondary | ICD-10-CM | POA: Diagnosis not present

## 2020-10-26 DIAGNOSIS — G4710 Hypersomnia, unspecified: Secondary | ICD-10-CM | POA: Diagnosis not present

## 2020-10-26 DIAGNOSIS — M545 Low back pain, unspecified: Secondary | ICD-10-CM | POA: Diagnosis not present

## 2020-10-26 DIAGNOSIS — Z79891 Long term (current) use of opiate analgesic: Secondary | ICD-10-CM | POA: Diagnosis not present

## 2020-11-03 DIAGNOSIS — I10 Essential (primary) hypertension: Secondary | ICD-10-CM | POA: Diagnosis not present

## 2020-11-03 DIAGNOSIS — M5090 Cervical disc disorder, unspecified, unspecified cervical region: Secondary | ICD-10-CM | POA: Diagnosis not present

## 2020-12-13 DIAGNOSIS — I10 Essential (primary) hypertension: Secondary | ICD-10-CM | POA: Diagnosis not present

## 2020-12-13 DIAGNOSIS — Z1389 Encounter for screening for other disorder: Secondary | ICD-10-CM | POA: Diagnosis not present

## 2020-12-13 DIAGNOSIS — G894 Chronic pain syndrome: Secondary | ICD-10-CM | POA: Diagnosis not present

## 2020-12-13 DIAGNOSIS — Z0001 Encounter for general adult medical examination with abnormal findings: Secondary | ICD-10-CM | POA: Diagnosis not present

## 2020-12-13 DIAGNOSIS — Z1331 Encounter for screening for depression: Secondary | ICD-10-CM | POA: Diagnosis not present

## 2020-12-13 DIAGNOSIS — M5090 Cervical disc disorder, unspecified, unspecified cervical region: Secondary | ICD-10-CM | POA: Diagnosis not present

## 2021-01-12 DIAGNOSIS — I10 Essential (primary) hypertension: Secondary | ICD-10-CM | POA: Diagnosis not present

## 2021-01-12 DIAGNOSIS — K219 Gastro-esophageal reflux disease without esophagitis: Secondary | ICD-10-CM | POA: Diagnosis not present

## 2021-01-25 DIAGNOSIS — I10 Essential (primary) hypertension: Secondary | ICD-10-CM | POA: Diagnosis not present

## 2021-01-25 DIAGNOSIS — M545 Low back pain, unspecified: Secondary | ICD-10-CM | POA: Diagnosis not present

## 2021-01-25 DIAGNOSIS — Z79891 Long term (current) use of opiate analgesic: Secondary | ICD-10-CM | POA: Diagnosis not present

## 2021-01-25 DIAGNOSIS — G4710 Hypersomnia, unspecified: Secondary | ICD-10-CM | POA: Diagnosis not present

## 2021-02-12 DIAGNOSIS — I10 Essential (primary) hypertension: Secondary | ICD-10-CM | POA: Diagnosis not present

## 2021-02-12 DIAGNOSIS — M5090 Cervical disc disorder, unspecified, unspecified cervical region: Secondary | ICD-10-CM | POA: Diagnosis not present

## 2021-02-15 DIAGNOSIS — Z79899 Other long term (current) drug therapy: Secondary | ICD-10-CM | POA: Diagnosis not present

## 2021-02-15 DIAGNOSIS — K219 Gastro-esophageal reflux disease without esophagitis: Secondary | ICD-10-CM | POA: Diagnosis not present

## 2021-02-15 DIAGNOSIS — I10 Essential (primary) hypertension: Secondary | ICD-10-CM | POA: Diagnosis not present

## 2021-03-14 DIAGNOSIS — M5090 Cervical disc disorder, unspecified, unspecified cervical region: Secondary | ICD-10-CM | POA: Diagnosis not present

## 2021-03-14 DIAGNOSIS — I10 Essential (primary) hypertension: Secondary | ICD-10-CM | POA: Diagnosis not present

## 2021-04-14 DIAGNOSIS — I10 Essential (primary) hypertension: Secondary | ICD-10-CM | POA: Diagnosis not present

## 2021-04-14 DIAGNOSIS — M545 Low back pain, unspecified: Secondary | ICD-10-CM | POA: Diagnosis not present

## 2021-04-26 DIAGNOSIS — M545 Low back pain, unspecified: Secondary | ICD-10-CM | POA: Diagnosis not present

## 2021-04-26 DIAGNOSIS — I10 Essential (primary) hypertension: Secondary | ICD-10-CM | POA: Diagnosis not present

## 2021-04-26 DIAGNOSIS — G4710 Hypersomnia, unspecified: Secondary | ICD-10-CM | POA: Diagnosis not present

## 2021-04-26 DIAGNOSIS — Z79891 Long term (current) use of opiate analgesic: Secondary | ICD-10-CM | POA: Diagnosis not present

## 2021-05-15 DIAGNOSIS — I10 Essential (primary) hypertension: Secondary | ICD-10-CM | POA: Diagnosis not present

## 2021-05-15 DIAGNOSIS — M5090 Cervical disc disorder, unspecified, unspecified cervical region: Secondary | ICD-10-CM | POA: Diagnosis not present

## 2021-06-06 DIAGNOSIS — M5090 Cervical disc disorder, unspecified, unspecified cervical region: Secondary | ICD-10-CM | POA: Diagnosis not present

## 2021-06-06 DIAGNOSIS — R600 Localized edema: Secondary | ICD-10-CM | POA: Diagnosis not present

## 2021-06-06 DIAGNOSIS — Z23 Encounter for immunization: Secondary | ICD-10-CM | POA: Diagnosis not present

## 2021-06-06 DIAGNOSIS — K219 Gastro-esophageal reflux disease without esophagitis: Secondary | ICD-10-CM | POA: Diagnosis not present

## 2021-06-06 DIAGNOSIS — I10 Essential (primary) hypertension: Secondary | ICD-10-CM | POA: Diagnosis not present

## 2021-07-07 DIAGNOSIS — M5090 Cervical disc disorder, unspecified, unspecified cervical region: Secondary | ICD-10-CM | POA: Diagnosis not present

## 2021-07-07 DIAGNOSIS — I10 Essential (primary) hypertension: Secondary | ICD-10-CM | POA: Diagnosis not present

## 2021-07-26 DIAGNOSIS — I10 Essential (primary) hypertension: Secondary | ICD-10-CM | POA: Diagnosis not present

## 2021-07-26 DIAGNOSIS — Z79891 Long term (current) use of opiate analgesic: Secondary | ICD-10-CM | POA: Diagnosis not present

## 2021-07-26 DIAGNOSIS — G4710 Hypersomnia, unspecified: Secondary | ICD-10-CM | POA: Diagnosis not present

## 2021-07-26 DIAGNOSIS — M545 Low back pain, unspecified: Secondary | ICD-10-CM | POA: Diagnosis not present

## 2021-08-06 DIAGNOSIS — G4733 Obstructive sleep apnea (adult) (pediatric): Secondary | ICD-10-CM | POA: Diagnosis not present

## 2021-08-06 DIAGNOSIS — I10 Essential (primary) hypertension: Secondary | ICD-10-CM | POA: Diagnosis not present

## 2021-09-06 DIAGNOSIS — I10 Essential (primary) hypertension: Secondary | ICD-10-CM | POA: Diagnosis not present

## 2021-09-06 DIAGNOSIS — M5090 Cervical disc disorder, unspecified, unspecified cervical region: Secondary | ICD-10-CM | POA: Diagnosis not present

## 2021-10-07 DIAGNOSIS — I10 Essential (primary) hypertension: Secondary | ICD-10-CM | POA: Diagnosis not present

## 2021-10-07 DIAGNOSIS — M5090 Cervical disc disorder, unspecified, unspecified cervical region: Secondary | ICD-10-CM | POA: Diagnosis not present

## 2021-10-23 DIAGNOSIS — G4710 Hypersomnia, unspecified: Secondary | ICD-10-CM | POA: Diagnosis not present

## 2021-10-23 DIAGNOSIS — I10 Essential (primary) hypertension: Secondary | ICD-10-CM | POA: Diagnosis not present

## 2021-10-23 DIAGNOSIS — M545 Low back pain, unspecified: Secondary | ICD-10-CM | POA: Diagnosis not present

## 2021-10-23 DIAGNOSIS — Z79891 Long term (current) use of opiate analgesic: Secondary | ICD-10-CM | POA: Diagnosis not present

## 2021-11-04 DIAGNOSIS — I10 Essential (primary) hypertension: Secondary | ICD-10-CM | POA: Diagnosis not present

## 2021-11-04 DIAGNOSIS — G4733 Obstructive sleep apnea (adult) (pediatric): Secondary | ICD-10-CM | POA: Diagnosis not present

## 2021-11-26 DIAGNOSIS — I10 Essential (primary) hypertension: Secondary | ICD-10-CM | POA: Diagnosis not present

## 2021-11-26 DIAGNOSIS — K219 Gastro-esophageal reflux disease without esophagitis: Secondary | ICD-10-CM | POA: Diagnosis not present

## 2021-11-26 DIAGNOSIS — Z1331 Encounter for screening for depression: Secondary | ICD-10-CM | POA: Diagnosis not present

## 2021-11-26 DIAGNOSIS — Z1389 Encounter for screening for other disorder: Secondary | ICD-10-CM | POA: Diagnosis not present

## 2021-11-26 DIAGNOSIS — Z0001 Encounter for general adult medical examination with abnormal findings: Secondary | ICD-10-CM | POA: Diagnosis not present

## 2021-12-26 DIAGNOSIS — I10 Essential (primary) hypertension: Secondary | ICD-10-CM | POA: Diagnosis not present

## 2021-12-26 DIAGNOSIS — M5090 Cervical disc disorder, unspecified, unspecified cervical region: Secondary | ICD-10-CM | POA: Diagnosis not present

## 2022-01-23 DIAGNOSIS — G4710 Hypersomnia, unspecified: Secondary | ICD-10-CM | POA: Diagnosis not present

## 2022-01-23 DIAGNOSIS — I10 Essential (primary) hypertension: Secondary | ICD-10-CM | POA: Diagnosis not present

## 2022-01-23 DIAGNOSIS — Z79891 Long term (current) use of opiate analgesic: Secondary | ICD-10-CM | POA: Diagnosis not present

## 2022-01-23 DIAGNOSIS — M545 Low back pain, unspecified: Secondary | ICD-10-CM | POA: Diagnosis not present

## 2022-01-26 DIAGNOSIS — I10 Essential (primary) hypertension: Secondary | ICD-10-CM | POA: Diagnosis not present

## 2022-01-26 DIAGNOSIS — M5090 Cervical disc disorder, unspecified, unspecified cervical region: Secondary | ICD-10-CM | POA: Diagnosis not present

## 2022-02-25 DIAGNOSIS — M545 Low back pain, unspecified: Secondary | ICD-10-CM | POA: Diagnosis not present

## 2022-02-25 DIAGNOSIS — I10 Essential (primary) hypertension: Secondary | ICD-10-CM | POA: Diagnosis not present

## 2022-03-25 DEATH — deceased
